# Patient Record
Sex: Female | Born: 1990 | Race: Black or African American | Hispanic: No | Marital: Single | Smoking: Current every day smoker
Health system: Southern US, Community
[De-identification: ages and names within clinical notes are randomized; demographics above are authoritative.]

## PROBLEM LIST (undated history)

## (undated) HISTORY — PX: MANDIBLE FRACTURE SURGERY: SHX706

---
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
## 2011-11-02 ENCOUNTER — Emergency Department: Payer: Self-pay | Admitting: Emergency Medicine

## 2011-11-03 LAB — URINALYSIS, COMPLETE
Bacteria: NONE SEEN
Glucose,UR: NEGATIVE mg/dL (ref 0–75)
Ketone: NEGATIVE
Leukocyte Esterase: NEGATIVE
Ph: 5 (ref 4.5–8.0)
Protein: NEGATIVE
RBC,UR: <1 /HPF (ref 0–5)
WBC UR: 1 /HPF (ref 0–5)

## 2011-11-03 LAB — CBC
HGB: 13 g/dL (ref 12.0–16.0)
MCH: 31.4 pg (ref 26.0–34.0)
MCHC: 33.7 g/dL (ref 32.0–36.0)
MCV: 93 fL (ref 80–100)
Platelet: 258 10*3/uL (ref 150–440)
RDW: 13 % (ref 11.5–14.5)

## 2011-11-03 LAB — BASIC METABOLIC PANEL
BUN: 9 mg/dL (ref 7–18)
Calcium, Total: 8.9 mg/dL (ref 8.5–10.1)
Co2: 26 mmol/L (ref 21–32)
EGFR (Non-African Amer.): >60
Potassium: 3.8 mmol/L (ref 3.5–5.1)
Sodium: 142 mmol/L (ref 136–145)

## 2011-11-03 LAB — WET PREP, GENITAL

## 2011-11-03 LAB — HCG, QUANTITATIVE, PREGNANCY: Beta Hcg, Quant.: 1737 m[IU]/mL — ABNORMAL HIGH

## 2011-11-05 ENCOUNTER — Emergency Department: Payer: Self-pay | Admitting: Emergency Medicine

## 2011-11-05 LAB — URINALYSIS, COMPLETE
Bilirubin,UR: NEGATIVE
Glucose,UR: NEGATIVE mg/dL (ref 0–75)
Nitrite: NEGATIVE
RBC,UR: <1 /HPF (ref 0–5)
Squamous Epithelial: 3

## 2011-11-05 LAB — CBC
HCT: 39.5 % (ref 35.0–47.0)
HGB: 13.1 g/dL (ref 12.0–16.0)
MCH: 30.9 pg (ref 26.0–34.0)
MCHC: 33.2 g/dL (ref 32.0–36.0)
RDW: 12.3 % (ref 11.5–14.5)

## 2011-11-05 LAB — WET PREP, GENITAL

## 2011-11-05 LAB — HCG, QUANTITATIVE, PREGNANCY: Beta Hcg, Quant.: 2923 m[IU]/mL — ABNORMAL HIGH

## 2011-12-06 ENCOUNTER — Emergency Department: Payer: Self-pay | Admitting: Emergency Medicine

## 2011-12-06 LAB — CBC
HCT: 39.7 % (ref 35.0–47.0)
HGB: 13.2 g/dL (ref 12.0–16.0)
MCH: 30.5 pg (ref 26.0–34.0)
MCHC: 33.2 g/dL (ref 32.0–36.0)
MCV: 92 fL (ref 80–100)
RDW: 11.9 % (ref 11.5–14.5)

## 2012-01-08 ENCOUNTER — Encounter: Payer: Self-pay | Admitting: Obstetrics & Gynecology

## 2012-01-26 ENCOUNTER — Encounter: Payer: Self-pay | Admitting: Maternal & Fetal Medicine

## 2012-03-02 ENCOUNTER — Encounter: Payer: Self-pay | Admitting: Pediatrics

## 2012-03-14 ENCOUNTER — Observation Stay: Payer: Self-pay | Admitting: Obstetrics & Gynecology

## 2012-03-14 LAB — URINALYSIS, COMPLETE
Bilirubin,UR: NEGATIVE
Blood: NEGATIVE
Glucose,UR: NEGATIVE mg/dL (ref 0–75)
Ketone: NEGATIVE
Leukocyte Esterase: NEGATIVE
Nitrite: NEGATIVE
Ph: 7 (ref 4.5–8.0)
Protein: NEGATIVE
RBC,UR: NONE SEEN /HPF (ref 0–5)
Specific Gravity: 1.014 (ref 1.003–1.030)

## 2012-05-31 ENCOUNTER — Observation Stay: Payer: Self-pay

## 2012-06-01 ENCOUNTER — Observation Stay: Payer: Self-pay

## 2012-06-01 LAB — URINALYSIS, COMPLETE
Bilirubin,UR: NEGATIVE
Blood: NEGATIVE
Glucose,UR: NEGATIVE mg/dL (ref 0–75)
Nitrite: NEGATIVE
Protein: NEGATIVE
Specific Gravity: 1.011 (ref 1.003–1.030)

## 2012-06-08 ENCOUNTER — Inpatient Hospital Stay: Payer: Self-pay | Admitting: Obstetrics and Gynecology

## 2012-06-08 LAB — CBC WITH DIFFERENTIAL/PLATELET
Basophil #: 0 10*3/uL (ref 0.0–0.1)
Basophil %: 0.2 %
Eosinophil #: 0.1 10*3/uL (ref 0.0–0.7)
HCT: 31.1 % — ABNORMAL LOW (ref 35.0–47.0)
Lymphocyte #: 2.9 10*3/uL (ref 1.0–3.6)
MCHC: 34.7 g/dL (ref 32.0–36.0)
Monocyte %: 7.9 %
Neutrophil #: 10.6 10*3/uL — ABNORMAL HIGH (ref 1.4–6.5)
Neutrophil %: 71.5 %
Platelet: 235 10*3/uL (ref 150–440)
RDW: 12.2 % (ref 11.5–14.5)
WBC: 14.8 10*3/uL — ABNORMAL HIGH (ref 3.6–11.0)

## 2012-06-09 LAB — HEMATOCRIT: HCT: 28.6 % — ABNORMAL LOW (ref 35.0–47.0)

## 2012-12-27 ENCOUNTER — Emergency Department: Payer: Self-pay | Admitting: Emergency Medicine

## 2012-12-27 LAB — CBC
HCT: 35.8 % (ref 35.0–47.0)
MCH: 28.7 pg (ref 26.0–34.0)
MCV: 87 fL (ref 80–100)
Platelet: 286 10*3/uL (ref 150–440)
RBC: 4.12 10*6/uL (ref 3.80–5.20)
WBC: 11 10*3/uL (ref 3.6–11.0)

## 2012-12-27 LAB — URINALYSIS, COMPLETE
Bacteria: NONE SEEN
Bilirubin,UR: NEGATIVE
Glucose,UR: NEGATIVE mg/dL (ref 0–75)
Ketone: NEGATIVE
Protein: NEGATIVE
RBC,UR: 2 /HPF (ref 0–5)
Specific Gravity: 1.016 (ref 1.003–1.030)
WBC UR: 1 /HPF (ref 0–5)

## 2013-05-27 ENCOUNTER — Observation Stay: Payer: Self-pay

## 2013-07-09 ENCOUNTER — Observation Stay: Payer: Self-pay | Admitting: Obstetrics & Gynecology

## 2013-07-21 ENCOUNTER — Ambulatory Visit: Payer: Self-pay | Admitting: Obstetrics and Gynecology

## 2013-07-21 LAB — CBC WITH DIFFERENTIAL/PLATELET
Basophil %: 0.4 %
MCH: 29.4 pg (ref 26.0–34.0)
MCHC: 34.2 g/dL (ref 32.0–36.0)
Monocyte %: 5.8 %
Neutrophil #: 11.1 10*3/uL — ABNORMAL HIGH (ref 1.4–6.5)
Neutrophil %: 78 %
Platelet: 242 10*3/uL (ref 150–440)
RBC: 3.78 10*6/uL — ABNORMAL LOW (ref 3.80–5.20)
RDW: 12.9 % (ref 11.5–14.5)

## 2013-07-22 ENCOUNTER — Inpatient Hospital Stay: Payer: Self-pay | Admitting: Obstetrics and Gynecology

## 2013-07-23 LAB — HEMATOCRIT: HCT: 27 % — ABNORMAL LOW (ref 35.0–47.0)

## 2014-03-10 IMAGING — US US OB DETAIL+14 WK - NRPT [facility]
1 series · 14 of 28 positions shown · non-contrast
Comparison: none

[Series 1: us ob detail+14 wk - nrpt [facility] · 14 of 89 slices shown]
[im 4/89]
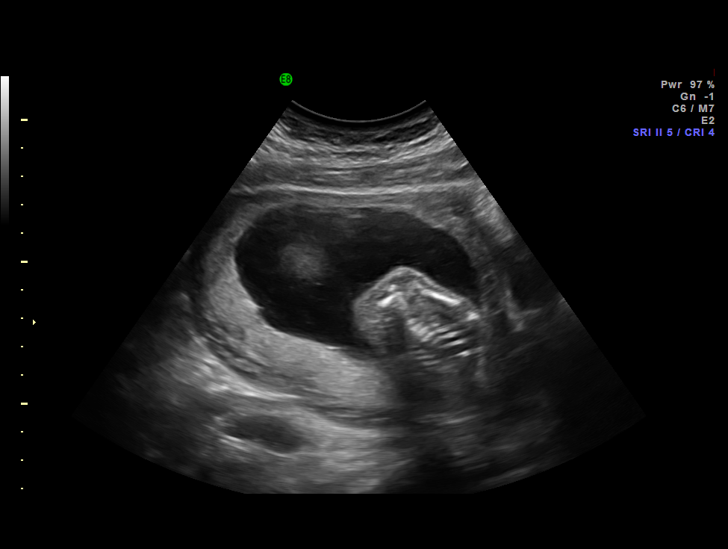
[im 10/89]
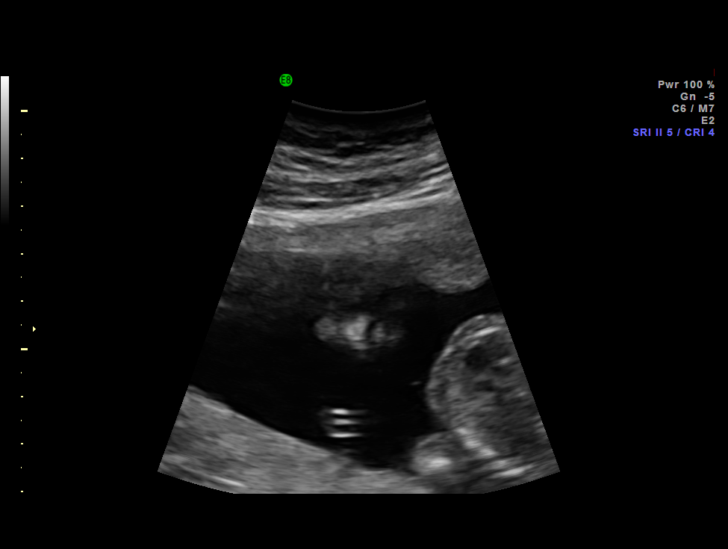
[im 17/89]
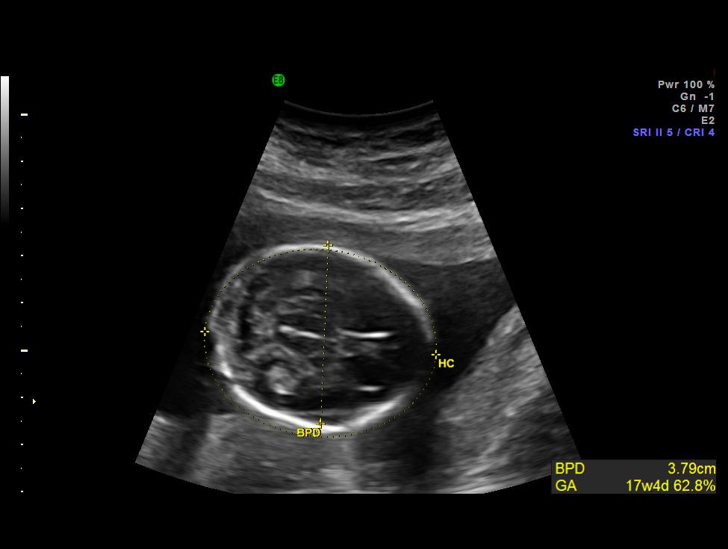
[im 23/89]
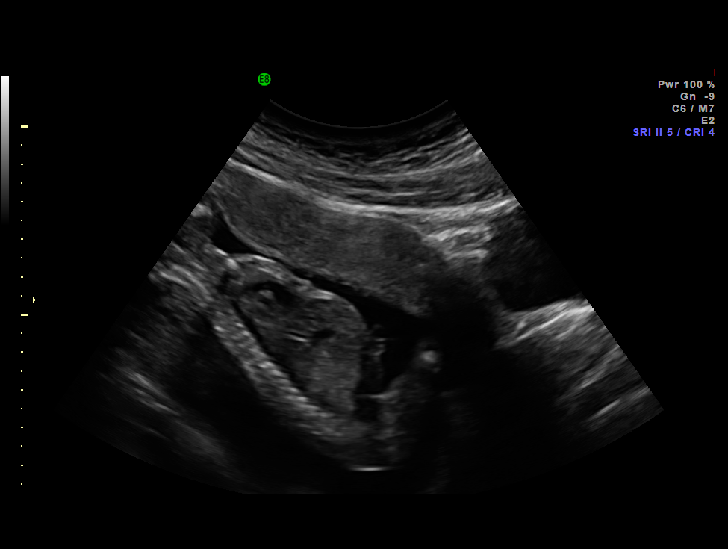
[im 30/89]
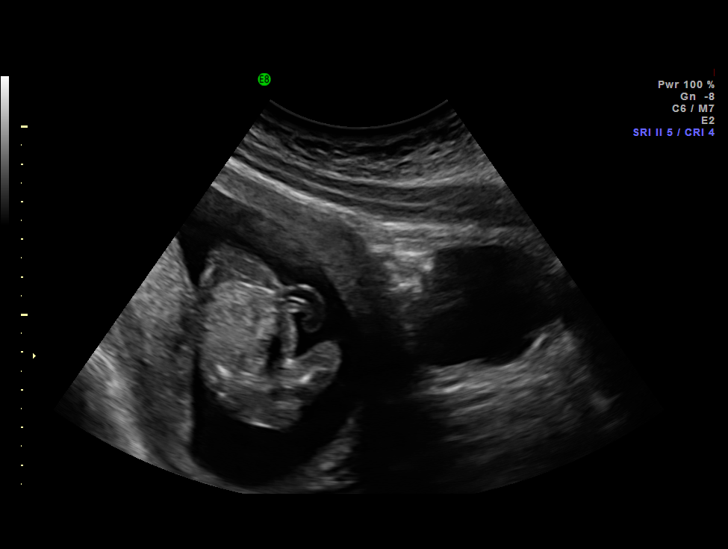
[im 36/89]
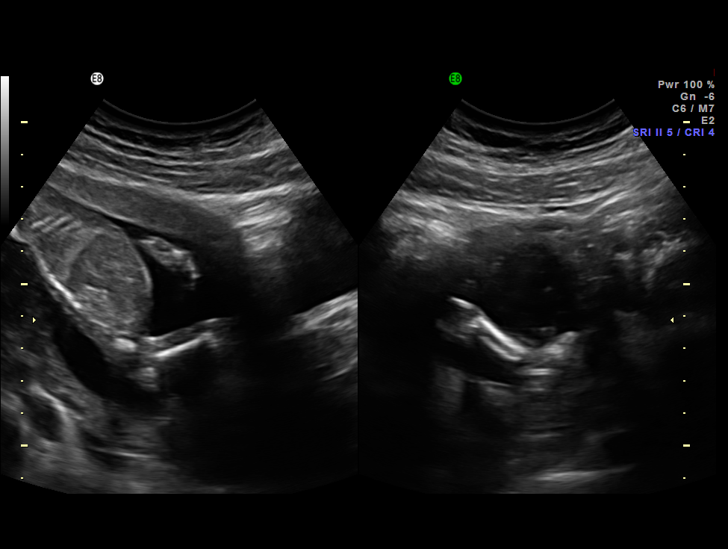
[im 43/89]
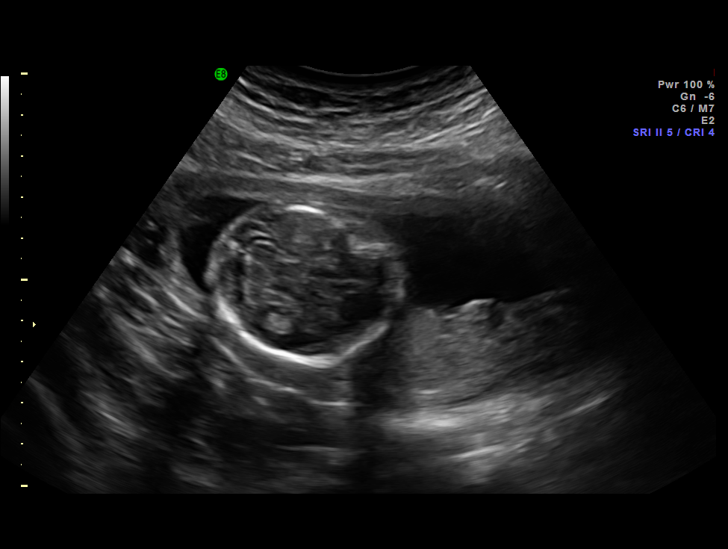
[im 49/89]
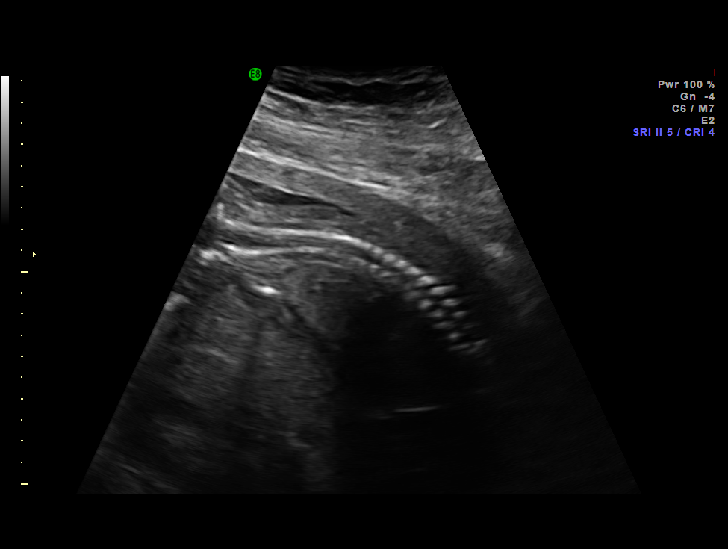
[im 56/89]
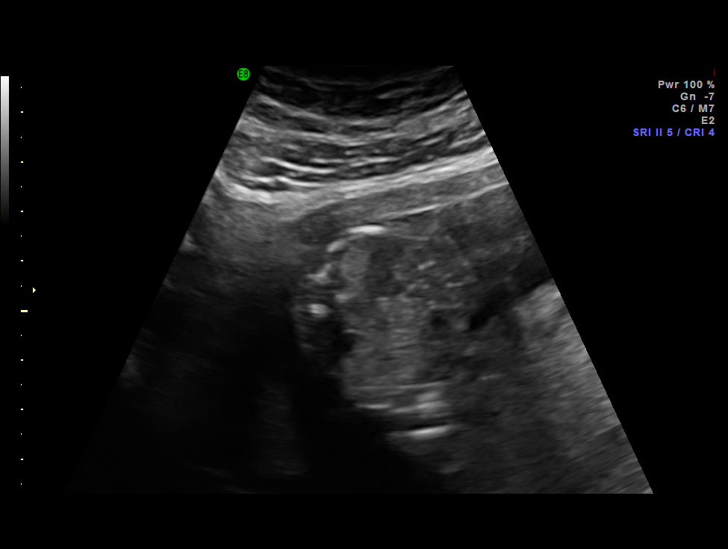
[im 62/89]
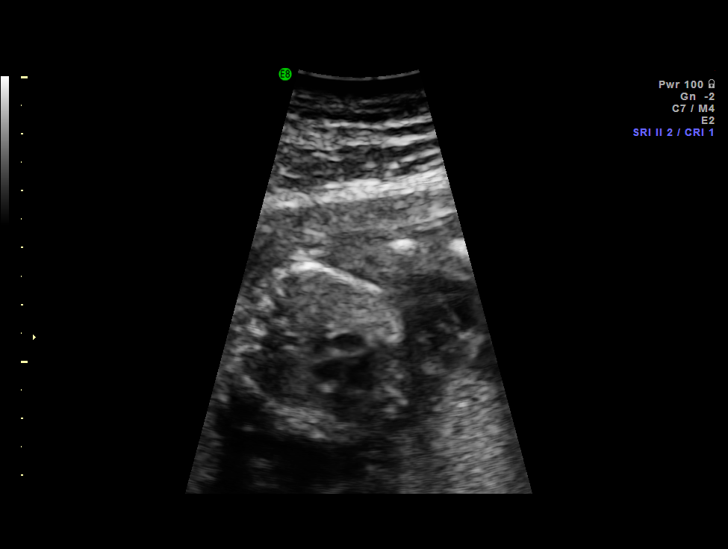
[im 69/89]
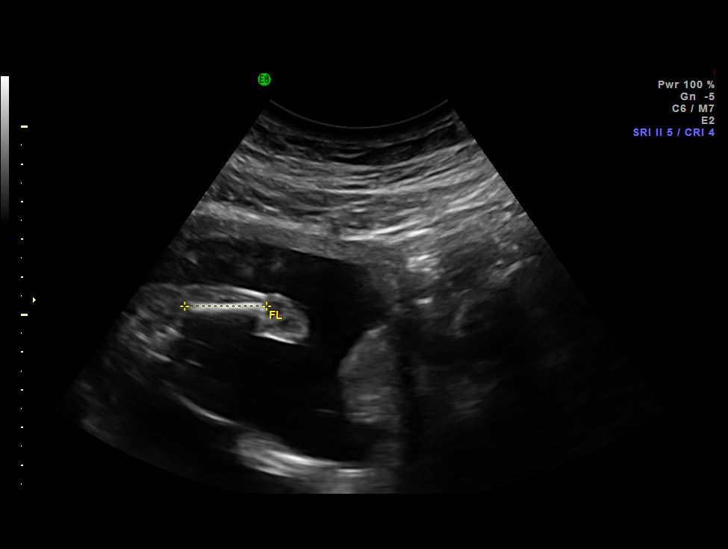
[im 75/89]
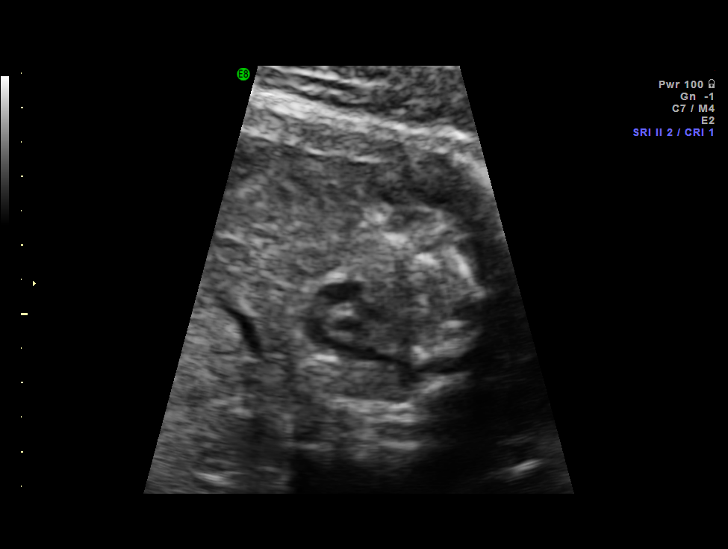
[im 82/89]
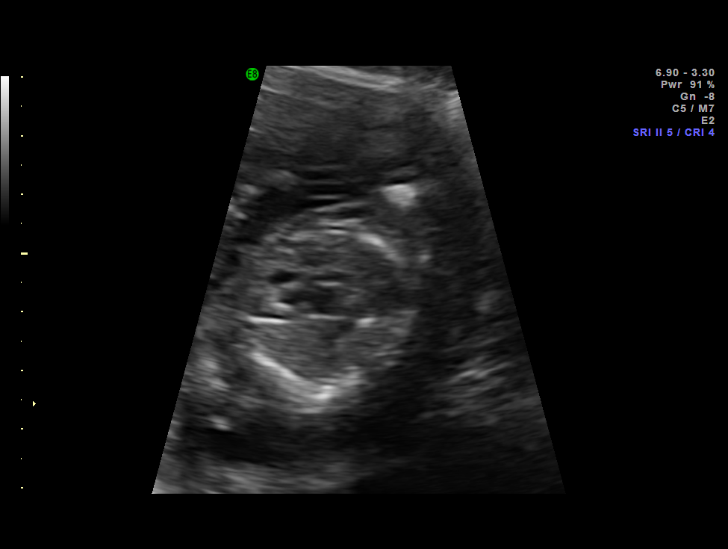
[im 89/89]
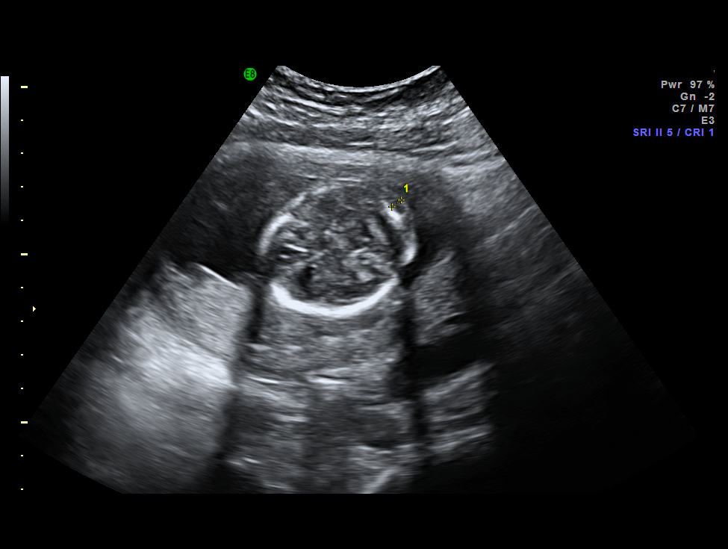

[14 of 28 positions shown; findings below may reference images not displayed]

IMAGES IMPORTED FROM THE SYNGO WORKFLOW SYSTEM
NO DICTATION FOR STUDY

## 2014-08-20 ENCOUNTER — Emergency Department: Payer: Self-pay | Admitting: Emergency Medicine

## 2015-02-13 NOTE — Op Note (Signed)
PATIENT NAME:  Heather GrizzleLATTERO, Leaner L MR#:  [id number] DATE OF BIRTH:  1991/10/11  DATE OF PROCEDURE:  06/08/2012  PREOPERATIVE DIAGNOSIS: Failure to progress.   POSTOPERATIVE DIAGNOSIS: Failure to progress.  PROCEDURE :  Low transverse cesarean section.   SURGEON: Senaida LangeLashawn Weaver-Lee, MD   ASSISTANT: Jettie Paganarcy Putnam   ANESTHESIA: General.   ESTIMATED BLOOD LOSS: 600 mL.   OPERATIVE FLUIDS: 700 mL.   COMPLICATIONS: None.   FINDINGS: Vertex female infant, 2390 grams, Apgars 9 and 9. Normal uterus, tubes, and ovaries.   SPECIMEN: None.   INDICATIONS: The patient is a 24 year old who presents with premature preterm rupture of membranes at 36 weeks. The baby has known Down syndrome. She was given Pitocin for labor augmentation; however, she did not progress past 3 cm. Decision was made to proceed towards cesarean section for delivery. The risks, benefits, indications, and alternatives of the procedure were explained and informed consent was obtained.   PROCEDURE: The patient was taken to the Operating Room with IV fluids running. She was prepped and draped in the usual sterile fashion in leftward tilt. A Pfannenstiel skin incision was made and carried down to underlying fascia with the knife. The fascia was nicked in the midline. The incision was extended laterally. The superior aspect of the fascia was grasped with Kocher's and the underlying rectus muscles dissected off. This was repeated on the inferior fascia. The rectus muscles were divided in the midline, and the peritoneum was entered bluntly. The opening was extended. A bladder blade was placed. The vesicouterine peritoneum was grasped with pick-ups and entered sharply with Metzenbaum's. The bladder flap was created digitally. The hysterotomy incision was made and carried down to underlying fetal parts. The incision was extended. The infant's head was grasped and an attempt to deliver was difficult, therefore vacuum suction was removed. The baby's  head was delivered atraumatically. The mouth and nose were bulb suctioned. The anterior and posterior shoulder were delivered followed by the remainder of the body. The cord was clamped x2 and cut. The baby was handed to the awaiting neonatologist. The placenta was expressed. The uterus was exteriorized and cleared of all clot and debris. The hysterotomy incision was repaired with a #0 Monocryl in a running locked fashion. The uterus was returned to the abdomen. The abdomen and gutters were irrigated with copious amounts of warm normal saline. The peritoneum was closed with 2-0 Vicryl. The On-Q pump apparatus was placed according to manufacturer's instructions. The rectus muscles were reapproximated. The fascia was closed with a #1 PDS. The skin was closed with 4-0 Vicryl on a Keith needle. The On-Q catheters were bolused with 0.5% Sensorcaine, 5 mL each. The catheters were secured to the patient's abdomen using Steri-Strips and Tegaderm. The patient tolerated procedure well. She was awakened from anesthesia and taken to the recovery room in stable condition. Sponge, needle and instrument counts were correct x2.  ____________________________ Sonda PrimesLashawn A. Patton SallesWeaver-Lee, MD law:cbb D: 06/08/2012 23:43:38 ET T: 06/09/2012 11:13:36 ET JOB#: [id number]  cc: Flint MelterLashawn A. Patton SallesWeaver-Lee, MD, <Dictator> Sonda PrimesLASHAWN A WEAVER LEE MD ELECTRONICALLY SIGNED 06/11/2012 10:29

## 2015-02-16 NOTE — Op Note (Signed)
PATIENT NAME:  Heather George, Heather George MR#:  [id number] DATE OF BIRTH:  [birth year]  DATE OF PROCEDURE:  07/22/2013  PREOPERATIVE DIAGNOSIS: Previous cesarean section, desires repeat.   POSTOPERATIVE DIAGNOSIS: Previous cesarean section, desires repeat.   PROCEDURE:  Repeat low transverse cesarean section.  SURGEON: Senaida LangeLashawn Weaver Lee, MD  ASSISTANT: Annamarie MajorPaul Harris, MD   ANESTHESIA: Spinal.  ESTIMATED BLOOD LOSS: 500 mL.  OPERATIVE FLUIDS: 1800 mL.  URINE OUTPUT: 450 mL.   COMPLICATIONS: None.   FINDINGS: Vertex female infant, 2930 grams, Apgars 9 and 9, normal uterus, tubes and ovaries.   INDICATION: The patient is a 24 year old G2, P1-0-0-1 who presents for repeat C-section. Risks, benefits, indications and alternatives of the procedure were explained and informed consent was obtained.   DESCRIPTION OF PROCEDURE: The patient was taken to the OR with IV fluids running. She was prepped and draped in the usual sterile fashion. After spinal anesthesia was placed, she was placed in a leftward tilt. A Pfannenstiel skin incision was made and carried down to underlying fascia with a knife. The fascia was nicked in the midline. The incision was extended laterally. The superior aspect of the fascia was grasped with Kocher clamps and the underlying rectus muscle dissected off. This was repeated on the inferior fascia. The rectus muscles were divided in the midline sharply using the Metzenbaum scissors. The opening was extended. The peritoneum was entered bluntly. The bladder blade was placed. The vesicouterine peritoneum was grasped with pick-ups and entered sharply with the Metzenbaum scissors. The bladder flap was created digitally. The hysterotomy incision was made and carried down to underlying fetal membranes. The opening was extended. The infant's head was grasped. The membranes were ruptured with an Allis. The infant's head was delivered atraumatically through the hysterotomy incision followed by the  anterior and posterior shoulder and the remainder of the body. The mouth and nose were bulb suctioned. The cord was clamped x 2 and cut, and the infant was handed to the waiting nursery staff. The placenta was expressed. The uterus was exteriorized and cleared of all clot and debris. The hysterotomy incision was repaired with a #0 Monocryl in a running locked fashion. The uterus was returned to the abdomen. The gutters were irrigated with copious amounts of warm normal saline. The peritoneum was closed with a #2-0 Vicryl. The On-Q pump apparatus was placed according to manufacturer's instructions. The fascia was closed with a #1 PDS. The skin was closed with 4-0 Vicryl. The catheters of the On-Q pump were both bolused with 5 mL of 0.5% bupivacaine plain. The openings through which the catheters were placed were closed and secured using Dermabond skin glue. The catheters were affixed to the patient's abdomen using Steri-Strips and Tegaderm. The patient tolerated the procedure well. Sponge, needle and instrument counts were correct x 2. The patient was taken to the recovery room in stable condition. ____________________________ Sonda PrimesLashawn A. Patton SallesWeaver-Lee, MD law:sb D: 07/22/2013 08:59:16 ET T: 07/22/2013 09:19:08 ET JOB#: [id number]  cc: Flint MelterLashawn A. Patton SallesWeaver-Lee, MD, <Dictator> Janyth ContesLASHAWN A WEAVER LEE MD ELECTRONICALLY SIGNED 08/08/2013 18:17

## 2015-03-06 NOTE — H&P (Signed)
L&D Evaluation:  History:  HPI 24 year old G3 P0111 with EDC=07/30/2013 presented at 31 weeks with c/o having a gush of fluid from the vagina when lying in bed this AM. Gush was accompanied by back pain. Has had no further leakage or back pain. Had IC last night. No VB. Good FM. PNC remarkable for close interconceptual spacing delivering 05/2012 via C-section at 36 weeks. First child has Down syndrome. Harmony testing this pregnancy was negative.   Presents with leaking fluid   Patient's Medical History No Chronic Illness   Patient's Surgical History D&C  Previous C-Section   Medications Pre Natal Vitamins   Allergies NKDA   Social History tobacco  drugs  MJ   Family History Non-Contributory   ROS:  ROS see HPI   Exam:  Vital Signs stable  116/69   Urine Protein not completed   General no apparent distress, appears anxious   Mental Status clear   Abdomen gravid, non-tender   Pelvic no external lesions, SSE: milky white fluid with negative pooling, Nitrazine, and fern. CX: closed/50%/_3   Mebranes Intact   FHT normal rate with no decels, 135-140 baseline with accels to 160s   FHT Description mod variability   Ucx absent   Skin dry   Impression:  Impression IUP at 31 weeks with no evidence of SROM   Plan:  Plan DC home with labor precautions.   [facility]   Electronic Signatures: Trinna BalloonGutierrez, Amalio Loe L (CNM)  (Signed 01-Aug-14 08:30)  Authored: L&D Evaluation   Last Updated: 01-Aug-14 08:30 by Trinna BalloonGutierrez, Vung Kush L (CNM)

## 2015-03-06 NOTE — H&P (Signed)
L&D Evaluation:  History Expanded:   H[facility] 24 [facility] [redacted]W[redacted]D.  Pt presents with SROM.  [facility].  PT HAS KNOWN DOWNS SYNDROME, TRISOME 21 INFANT    Blood Type (Maternal) O positive    Group B Strep Results Maternal (Result >5wks must be treated as unknown) unknown/result > 5 weeks ago    Maternal HIV Negative    Maternal Syphilis Ab Nonreactive    Maternal Varicella Immune    Rubella Results (Maternal) immune    Presents with leaking fluid    Patient's Medical History No Chronic Illness    Patient's Surgical History none    Medications Pre Natal Vitamins  Zantac, Zoloft    Allergies NKDA    Social History tobacco   Exam:   Vital Signs stable    General no apparent distress    Mental Status clear    Chest clear    Heart normal sinus rhythm    Abdomen gravid, non-tender    Estimated Fetal Weight Average for gestational age    Mebranes Ruptured, copius clear fluid    Description clear    FHT normal rate with no decels   Impression:   Impression Prematue Rupture of Membranes   Plan:   Comments I have had a long and careful discussion with this patient about options,  Mutual decision made to commit to delivery.  Pt has been fully informed of the pros and cons, risk/benefits continued close observation versus the risks of induction. She understands that there are uncommon risks to induction, which include but are not limited to:  frequent and/or prolonged uterine contractions, fetal distress, uterine rupture and lack of success of induction.  She also has been informed that if the induction is not successful a Cesarean Section may be necessary.  All questions have been answered and she is in agreement with induction.   Electronic Signatures: Towana Badgerosenow, Jader Desai J (MD)  (Signed 13-Aug-13 07:29)  Authored: L&D Evaluation   Last Updated: 13-Aug-13 07:29 by Towana Badgerosenow, Lindbergh Winkles J (MD)

## 2015-03-06 NOTE — H&P (Signed)
L&D Evaluation:  History Expanded:  HPI 37 weeks, Prior Cesarean Section, DFM today (since last pm), although noted movement here on Labor and Delivery now.   Gravida 2   Term 1   Presents with decreased fetal movement   Patient's Medical History No Chronic Illness   Patient's Surgical History Previous C-Section   Medications Pre Natal Vitamins   Allergies NKDA   Social History none   Family History Non-Contributory   ROS:  ROS All systems were reviewed.  HEENT, CNS, GI, GU, Respiratory, CV, Renal and Musculoskeletal systems were found to be normal.   Exam:  Vital Signs stable   General no apparent distress   Mental Status clear   Abdomen gravid, non-tender   Estimated Fetal Weight Average for gestational age   Back no CVAT   Edema no edema   FHT normal rate with no decels, REACTIVE   Fetal Heart Rate 140   Ucx absent   Impression:  Impression decreased fetal movement   Plan:  Plan Non-Stress Test Reactive   Comments Counseled on fetal movements/ monitoring.   Follow Up Appointment already scheduled. TUES   Electronic Signatures: Letitia LibraHarris, Leander Tout Paul (MD)  (Signed 13-Sep-14 09:57)  Authored: L&D Evaluation   Last Updated: 13-Sep-14 09:57 by Letitia LibraHarris, Lenin Kuhnle Paul (MD)

## 2015-03-06 NOTE — H&P (Signed)
L&D Evaluation:  History Expanded:   HPI 24 yo presents at 23 weeks w EPIGASTRIC PAIN and  mild n/v.  No contractions or rom or vb.  Pain is mod at times, imntermittant, no modifers or other context, no assoc symptoms.    Presents with abdominal pain    Patient's Medical History No Chronic Illness    Patient's Surgical History none    Medications Pre Natal Vitamins    Allergies NKDA    Social History none    Family History Non-Contributory   ROS:   ROS All systems were reviewed.  HEENT, CNS, GI, GU, Respiratory, CV, Renal and Musculoskeletal systems were found to be normal.   Exam:   Vital Signs stable    General no apparent distress    Mental Status clear    Chest clear    Heart normal sinus rhythm    Abdomen gravid, non-tender    Estimated Fetal Weight Average for gestational age    Back no CVAT    Edema no edema    FHT FHR 140    Skin dry   Impression:   Impression GERD, Abd Pain   Plan:   Plan fluids    Comments Zantac UA neg Note for work today Follow up office tomorrow   Electronic Signatures: Letitia LibraHarris, Kimiya Brunelle Paul (MD)  (Signed 19-May-13 20:51)  Authored: L&D Evaluation   Last Updated: 19-May-13 20:51 by Letitia LibraHarris, Melrose Kearse Paul (MD)

## 2016-01-06 ENCOUNTER — Emergency Department
Admission: EM | Admit: 2016-01-06 | Discharge: 2016-01-06 | Disposition: A | Discharge status: Home / Self Care | Payer: Self-pay | Attending: Student | Admitting: Student

## 2016-01-06 ENCOUNTER — Emergency Department: Payer: Self-pay

## 2016-01-06 ENCOUNTER — Encounter: Payer: Self-pay | Admitting: Emergency Medicine

## 2016-01-06 DIAGNOSIS — Y999 Unspecified external cause status: Secondary | ICD-10-CM | POA: Insufficient documentation

## 2016-01-06 DIAGNOSIS — F17210 Nicotine dependence, cigarettes, uncomplicated: Secondary | ICD-10-CM | POA: Insufficient documentation

## 2016-01-06 DIAGNOSIS — M79672 Pain in left foot: Secondary | ICD-10-CM

## 2016-01-06 DIAGNOSIS — Y9302 Activity, running: Secondary | ICD-10-CM | POA: Insufficient documentation

## 2016-01-06 DIAGNOSIS — Y929 Unspecified place or not applicable: Secondary | ICD-10-CM | POA: Insufficient documentation

## 2016-01-06 DIAGNOSIS — S99922A Unspecified injury of left foot, initial encounter: Secondary | ICD-10-CM | POA: Insufficient documentation

## 2016-01-06 DIAGNOSIS — W1849XA Other slipping, tripping and stumbling without falling, initial encounter: Secondary | ICD-10-CM | POA: Insufficient documentation

## 2016-01-06 MED ORDER — OXYCODONE HCL 5 MG PO TABS
ORAL_TABLET | ORAL | Status: AC
  Administered 2016-01-06: 5 mg via ORAL
  Filled 2016-01-06: qty 1

## 2016-01-06 MED ORDER — IBUPROFEN 600 MG PO TABS: 600.0000 mg | ORAL_TABLET | Freq: Three times a day (TID) | ORAL | Status: AC | PRN

## 2016-01-06 MED ORDER — OXYCODONE HCL 5 MG PO TABS
5.0000 mg | ORAL_TABLET | Freq: Once | ORAL | Status: AC
  Administered 2016-01-06: 5 mg via ORAL

## 2016-01-06 NOTE — ED Notes (Signed)
Reviewed d/c instructions, follow-up care, prescriptions, and use of ice/elevation with pt. Pt verbalized understanding.

## 2016-01-06 NOTE — ED Notes (Signed)
Pt says she heard her kids in the tub arguing and she thought they were hurting each other; went running into the bathroom and tripped in the doorway; c/o pain  To the top of her left foot;

## 2016-01-06 NOTE — ED Provider Notes (Signed)
[facility] Emergency Department Provider Note  ____________________________________________  Time seen: Approximately 1:52 AM  I have reviewed the triage vital signs and the nursing notes.   HISTORY  Chief Complaint Foot Pain    HPI Margarita GrizzleJohana L Jarriel is a 25 y.o. female no chronic medical problems who presents for evaluation of traumatic left foot pain which began suddenly just prior to arrival, constant since onset, currently moderate and worse with bearing weight. She reports that she heard her children arguing in the bathtub and was running towards the bathroom when she tripped and hit her foot on a loose floor board. She did not hit her head or lose consciousness. she denies any other pain complaints. No chest pain or difficulty breathing. She has otherwise been in her usual state of health.   History reviewed. No pertinent past medical history.  There are no active problems to display for this patient.   Past Surgical History  Procedure Laterality Date  . Cesarean section      No current outpatient prescriptions on file.  Allergies Review of patient's allergies indicates no known allergies.  History reviewed. No pertinent family history.  Social History Social History  Substance Use Topics  . Smoking status: Current Every Day Smoker -- 1.00 packs/day    Types: Cigarettes  . Smokeless tobacco: None  . Alcohol Use: Yes     Comment: 3-4 beers tonight    Review of Systems Constitutional: No fever/chills Eyes: No visual changes. ENT: No sore throat. Cardiovascular: Denies chest pain. Respiratory: Denies shortness of breath. Gastrointestinal: No abdominal pain.  No nausea, no vomiting.  No diarrhea.  No constipation. Genitourinary: Negative for dysuria. Musculoskeletal: Negative for back pain. Skin: Negative for rash. Neurological: Negative for headaches, focal weakness or numbness.  10-point ROS otherwise  negative.  ____________________________________________   PHYSICAL EXAM:  VITAL SIGNS: ED Triage Vitals  Enc Vitals Group     BP 01/06/16 0020 139/74 mmHg     Pulse Rate 01/06/16 0020 97     Resp 01/06/16 0020 18     Temp 01/06/16 0020 98.2 F (36.8 C)     Temp Source 01/06/16 0020 Oral     SpO2 01/06/16 0020 98 %     Weight 01/06/16 0020 179 lb (81.194 kg)     Height 01/06/16 0020 5\' 5"  (1.651 m)     Head Cir --      Peak Flow --      Pain Score 01/06/16 0020 10     Pain Loc --      Pain Edu? --      Excl. in GC? --     Constitutional: Alert and oriented. Well appearing and in no acute distress. Eyes: Conjunctivae are normal. PERRL. EOMI. Head: Atraumatic. Nose: No congestion/rhinnorhea. Mouth/Throat: Mucous membranes are moist.  Oropharynx non-erythematous. Neck: No stridor.  No cervical spine tenderness to palpation. Cardiovascular: Normal rate, regular rhythm. Grossly normal heart sounds.  Good peripheral circulation. Respiratory: Normal respiratory effort.  No retractions. Lungs CTAB. Gastrointestinal: Soft and nontender. No distention. No CVA tenderness. Genitourinary: deferred Musculoskeletal: There is moderate tenderness to palpation in the dorsum of the left foot without bony step-off or deformity or ecchymosis. 2+ left DP pulse, wiggles the toes. Neurologic:  Normal speech and language. No gross focal neurologic deficits are appreciated. No gait instability. Skin:  Skin is warm, dry and intact. No rash noted. Psychiatric: Mood and affect are normal. Speech and behavior are normal.  ____________________________________________   LABS (  all labs ordered are listed, but only abnormal results are displayed)  Labs Reviewed - No data to display ____________________________________________  EKG  none ____________________________________________  RADIOLOGY  Xray left foot IMPRESSION: 1. No acute finding. 2. 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 ____________________________________________   PROCEDURES  Procedure(s) performed: None  Critical Care performed: No  ____________________________________________   INITIAL IMPRESSION / ASSESSMENT AND PLAN / ED COURSE  Pertinent labs & imaging results that were available during my care of the patient were reviewed by me and considered in my medical decision making (see chart for details).  AMEERAH HUFFSTETLER is a 25 y.o. female no chronic medical problems who presents for evaluation of traumatic left foot pain. On exam, she is well-appearing and in no acute distress. Vital signs stable, she is afebrile. Clinical picture is most consistent with contusion. X-rays show no fracture or dislocation. Discussed return precautions, need for close PCP follow-up and she is comfortable with the discharge plan. DC home with supportive care. ____________________________________________   FINAL CLINICAL IMPRESSION(S) / ED DIAGNOSES  Final diagnoses:  Foot pain, left      Gayla Doss, MD 01/06/16 [phone]

## 2016-01-06 NOTE — ED Notes (Signed)
Pt. States she fell while running to bathroom and tripped on raised board in floor.  Pt. Denies LOC or falling to the ground.  Pt. States lt. Foot got twisted when she tripped.  Pt. Denies any other injury at this time.

## 2017-09-11 ENCOUNTER — Ambulatory Visit (HOSPITAL_COMMUNITY)
Admission: EM | Admit: 2017-09-11 | Discharge: 2017-09-11 | Disposition: A | Discharge status: Home / Self Care | Payer: Self-pay | Attending: Internal Medicine | Admitting: Internal Medicine

## 2017-09-11 ENCOUNTER — Encounter (HOSPITAL_COMMUNITY): Payer: Self-pay | Admitting: Emergency Medicine

## 2017-09-11 DIAGNOSIS — J00 Acute nasopharyngitis [common cold]: Secondary | ICD-10-CM

## 2017-09-11 MED ORDER — FLUTICASONE PROPIONATE 50 MCG/ACT NA SUSP: 2.0000 | Freq: Every day | NASAL | 0 refills | Status: AC

## 2017-09-11 MED ORDER — CETIRIZINE-PSEUDOEPHEDRINE ER 5-120 MG PO TB12: 1.0000 | ORAL_TABLET | Freq: Every day | ORAL | 0 refills | Status: AC

## 2017-09-11 MED ORDER — BENZONATATE 100 MG PO CAPS: 100.0000 mg | ORAL_CAPSULE | Freq: Three times a day (TID) | ORAL | 0 refills | Status: AC

## 2017-09-11 NOTE — ED Triage Notes (Signed)
PT C/O: cold sx  ONSET: 2 days   SX INCLUDE: nasal congestion, hot flashes, chills, prod cough, nauseas, HA  DENIES: fevers  TAKING MEDS: Mucinex w/no relief.   A&O x4... NAD... Ambulatory

## 2017-09-11 NOTE — ED Provider Notes (Signed)
[facility]    CSN: [id number] Arrival date & time: 09/11/17  1128     History   Chief Complaint Chief Complaint  Patient presents with  . URI    HPI Heather George is a 26 y.o. female.   26 year old female comes in with 2 day history of URI symptoms. She is experiencing nasal congestion, hot flashes, chills, productive cough, nausea, headache, sore throat, body aches. Denies fever, ear pain. Denies swelling of the throat, trouble breathing, trouble swallowing. Chest soreness with coughing. Denies shortness of breath, wheezing. Taken mucinex without relief. Current everyday smoker, 1ppd, 11 pack year history.       History reviewed. No pertinent past medical history.  There are no active problems to display for this patient.   Past Surgical History:  Procedure Laterality Date  . CESAREAN SECTION      OB History    No data available       Home Medications    Prior to Admission medications   Medication Sig Start Date End Date Taking? Authorizing Provider  benzonatate (TESSALON) 100 MG capsule Take 1 capsule (100 mg total) every 8 (eight) hours by mouth. 09/11/17   Cathie HoopsYu, Asenath Balash V, PA-C  cetirizine-pseudoephedrine (ZYRTEC-D) 5-120 MG tablet Take 1 tablet daily by mouth. 09/11/17   Demontrae Gilbert V, PA-C  fluticasone (FLONASE) 50 MCG/ACT nasal spray Place 2 sprays daily into both nostrils. 09/11/17   Cathie HoopsYu, Aspasia Rude V, PA-C  ibuprofen (ADVIL,MOTRIN) 600 MG tablet Take 1 tablet (600 mg total) by mouth every 8 (eight) hours as needed for moderate pain. Take this medication only if you are sure you're not pregnant. 01/06/16   Gayla DossGayle, Eryka A, MD    Family History History reviewed. No pertinent family history.  Social History Social History   Tobacco Use  . Smoking status: Current Every Day Smoker    Packs/day: 1.00    Types: Cigarettes  . Smokeless tobacco: Never Used  Substance Use Topics  . Alcohol use: Yes    Comment: 3-4 beers tonight  . Drug use: No      Allergies   Patient has no known allergies.   Review of Systems Review of Systems  Reason unable to perform ROS: See HPI as above.     Physical Exam Triage Vital Signs ED Triage Vitals  Enc Vitals Group     BP 09/11/17 1232 (!) 103/55     Pulse Rate 09/11/17 1232 90     Resp 09/11/17 1232 (!) 22     Temp 09/11/17 1232 98.3 F (36.8 C)     Temp Source 09/11/17 1232 Oral     SpO2 09/11/17 1232 99 %     Weight --      Height --      Head Circumference --      Peak Flow --      Pain Score 09/11/17 1233 4     Pain Loc --      Pain Edu? --      Excl. in GC? --    No data found.  Updated Vital Signs BP (!) 103/55 (BP Location: Left Arm)   Pulse 90   Temp 98.3 F (36.8 C) (Oral)   Resp (!) 22   LMP 08/29/2017   SpO2 99%   Physical Exam  Constitutional: She is oriented to person, place, and time. She appears well-developed and well-nourished. No distress.  HENT:  Head: Normocephalic and atraumatic.  Right Ear: External ear and ear  canal normal. Tympanic membrane is erythematous. Tympanic membrane is not bulging.  Left Ear: External ear and ear canal normal. Tympanic membrane is erythematous. Tympanic membrane is not bulging.  Nose: Mucosal edema and rhinorrhea present. Right sinus exhibits maxillary sinus tenderness. Right sinus exhibits no frontal sinus tenderness. Left sinus exhibits maxillary sinus tenderness. Left sinus exhibits no frontal sinus tenderness.  Mouth/Throat: Uvula is midline and mucous membranes are normal. Posterior oropharyngeal erythema present. Tonsils are 0 on the right. Tonsils are 0 on the left. No tonsillar exudate.  Eyes: Conjunctivae are normal. Pupils are equal, round, and reactive to light.  Neck: Normal range of motion. Neck supple.  Cardiovascular: Normal rate, regular rhythm and normal heart sounds. Exam reveals no gallop and no friction rub.  No murmur heard. Pulmonary/Chest: Effort normal and breath sounds normal. She has no  decreased breath sounds. She has no wheezes. She has no rhonchi. She has no rales.  Lymphadenopathy:    She has no cervical adenopathy.  Neurological: She is alert and oriented to person, place, and time.  Skin: Skin is warm and dry.  Psychiatric: She has a normal mood and affect. Her behavior is normal. Judgment normal.     UC Treatments / Results  Labs (all labs ordered are listed, but only abnormal results are displayed) Labs Reviewed - No data to display  EKG  EKG Interpretation None       Radiology No results found.  Procedures Procedures (including critical care time)  Medications Ordered in UC Medications - No data to display   Initial Impression / Assessment and Plan / UC Course  I have reviewed the triage vital signs and the nursing notes.  Pertinent labs & imaging results that were available during my care of the patient were reviewed by me and considered in my medical decision making (see chart for details).    Discussed with patient history and exam most consistent with viral URI. Symptomatic treatment as needed. Push fluids. Return precautions given.   Final Clinical Impressions(s) / UC Diagnoses   Final diagnoses:  Acute nasopharyngitis    ED Discharge Orders        Ordered    fluticasone (FLONASE) 50 MCG/ACT nasal spray  Daily     09/11/17 1338    cetirizine-pseudoephedrine (ZYRTEC-D) 5-120 MG tablet  Daily     09/11/17 1338    benzonatate (TESSALON) 100 MG capsule  Every 8 hours     09/11/17 1338        Belinda FisherYu, Obrien Huskins V, PA-C 09/11/17 1346

## 2017-09-11 NOTE — Discharge Instructions (Signed)
Tessalon for cough. Start flonase, zyrtec-D for nasal congestion. You can use over the counter nasal saline rinse such as neti pot for nasal congestion. Keep hydrated, your urine should be clear to pale yellow in color. Tylenol/motrin for fever and pain. Monitor for any worsening of symptoms, chest pain, shortness of breath, wheezing, swelling of the throat, follow up for reevaluation.  ° °For sore throat try using a honey-based tea. Use 3 teaspoons of honey with juice squeezed from half lemon. Place shaved pieces of ginger into 1/2-1 cup of water and warm over stove top. Then mix the ingredients and repeat every 4 hours as needed. ° °

## 2018-03-02 ENCOUNTER — Encounter (HOSPITAL_COMMUNITY): Payer: Self-pay | Admitting: *Deleted

## 2018-03-02 ENCOUNTER — Other Ambulatory Visit: Payer: Self-pay

## 2018-03-02 ENCOUNTER — Emergency Department (HOSPITAL_COMMUNITY)
Admission: EM | Admit: 2018-03-02 | Discharge: 2018-03-02 | Disposition: A | Discharge status: Home / Self Care | Payer: Self-pay | Attending: Emergency Medicine | Admitting: Emergency Medicine

## 2018-03-02 ENCOUNTER — Encounter (HOSPITAL_COMMUNITY): Payer: Self-pay | Admitting: Emergency Medicine

## 2018-03-02 DIAGNOSIS — Z79899 Other long term (current) drug therapy: Secondary | ICD-10-CM | POA: Insufficient documentation

## 2018-03-02 DIAGNOSIS — K029 Dental caries, unspecified: Secondary | ICD-10-CM | POA: Insufficient documentation

## 2018-03-02 DIAGNOSIS — Z5321 Procedure and treatment not carried out due to patient leaving prior to being seen by health care provider: Secondary | ICD-10-CM | POA: Insufficient documentation

## 2018-03-02 DIAGNOSIS — K0889 Other specified disorders of teeth and supporting structures: Secondary | ICD-10-CM

## 2018-03-02 DIAGNOSIS — F17210 Nicotine dependence, cigarettes, uncomplicated: Secondary | ICD-10-CM | POA: Insufficient documentation

## 2018-03-02 MED ORDER — BUPIVACAINE HCL (PF) 0.25 % IJ SOLN
10.0000 mL | Freq: Once | INTRAMUSCULAR | Status: AC
  Administered 2018-03-02: 10 mL
  Filled 2018-03-02: qty 30

## 2018-03-02 MED ORDER — TRAMADOL HCL 50 MG PO TABS: 50.0000 mg | ORAL_TABLET | Freq: Four times a day (QID) | ORAL | 0 refills | Status: AC | PRN

## 2018-03-02 MED ORDER — AMOXICILLIN 500 MG PO CAPS
500.0000 mg | ORAL_CAPSULE | Freq: Once | ORAL | Status: AC
  Administered 2018-03-02: 500 mg via ORAL
  Filled 2018-03-02: qty 1

## 2018-03-02 MED ORDER — AMOXICILLIN 500 MG PO CAPS: 500.0000 mg | ORAL_CAPSULE | Freq: Three times a day (TID) | ORAL | 0 refills | Status: AC

## 2018-03-02 NOTE — ED Notes (Signed)
Called pt to re-assess vitals x2, no answer

## 2018-03-02 NOTE — ED Triage Notes (Signed)
C/o toothache right lower back tooth

## 2018-03-02 NOTE — ED Provider Notes (Signed)
[facility]-EMERGENCY DEPT Provider Note   CSN: [id number] Arrival date & time: 03/02/18  0944     History   Chief Complaint Chief Complaint  Patient presents with  . Dental Pain    HPI Heather George is a 27 y.o. female.  HPI Patient presents to the ED with no pertinent past medical history for evaluation of dental pain.  Reports ongoing right bottom molar for about the past week.  Reports taking BC powders without any relief.  Patient does not have dental insurance and cannot follow-up with a dentist.  Denies any difficulties breathing or swallowing.  Denies any associated facial pain, swelling, fevers, chills.  Nothing makes symptoms better.  Reports the pain is a 10/10.  She did not take anything today prior to coming to the ED.  Denies any neck stiffness.  Denies any associated nausea or vomiting. History reviewed. No pertinent past medical history.  There are no active problems to display for this patient.   Past Surgical History:  Procedure Laterality Date  . CESAREAN SECTION    . MANDIBLE FRACTURE SURGERY       OB History   None      Home Medications    Prior to Admission medications   Medication Sig Start Date End Date Taking? Authorizing Provider  fluticasone (FLONASE) 50 MCG/ACT nasal spray Place 2 sprays daily into both nostrils. 09/11/17  Yes Yu, Amy V, PA-C  ibuprofen (ADVIL,MOTRIN) 600 MG tablet Take 1 tablet (600 mg total) by mouth every 8 (eight) hours as needed for moderate pain. Take this medication only if you are sure you're not pregnant. 01/06/16  Yes Gayla Doss, MD  amoxicillin (AMOXIL) 500 MG capsule Take 1 capsule (500 mg total) by mouth 3 (three) times daily. 03/02/18   Rise Mu, PA-C  benzonatate (TESSALON) 100 MG capsule Take 1 capsule (100 mg total) every 8 (eight) hours by mouth. 09/11/17   Cathie Hoops, Amy V, PA-C  cetirizine-pseudoephedrine (ZYRTEC-D) 5-120 MG tablet Take 1 tablet daily by mouth. 09/11/17   Cathie Hoops, Amy  V, PA-C  traMADol (ULTRAM) 50 MG tablet Take 1 tablet (50 mg total) by mouth every 6 (six) hours as needed. 03/02/18   Rise Mu, PA-C    Family History No family history on file.  Social History Social History   Tobacco Use  . Smoking status: Current Every Day Smoker    Packs/day: 1.00    Types: Cigarettes  . Smokeless tobacco: Never Used  Substance Use Topics  . Alcohol use: Yes    Comment: 3-4 beers tonight  . Drug use: No     Allergies   Patient has no known allergies.   Review of Systems Review of Systems  Constitutional: Negative for chills and fever.  HENT: Positive for dental problem. Negative for congestion and trouble swallowing.   Respiratory: Negative for shortness of breath.   Gastrointestinal: Negative for nausea and vomiting.     Physical Exam Updated Vital Signs BP 121/80 (BP Location: Left Arm)   Pulse 75   Temp 98.2 F (36.8 C) (Oral)   Resp 16   LMP 02/08/2018   SpO2 99%   Physical Exam  Constitutional: She appears well-developed and well-nourished. No distress.  HENT:  Head: Normocephalic and atraumatic.  Mouth/Throat: Uvula is midline, oropharynx is clear and moist and mucous membranes are normal. Abnormal dentition. Dental caries present.    Oropharynx is clear.  No sublingual or submandibular swelling.  Tolerating secretions managing airway.  No facial swelling.  Eyes: Right eye exhibits no discharge. Left eye exhibits no discharge. No scleral icterus.  Neck: Normal range of motion. Neck supple.  Pulmonary/Chest: No respiratory distress.  Musculoskeletal: Normal range of motion.  Lymphadenopathy:    She has no cervical adenopathy.  Neurological: She is alert.  Skin: No pallor.  Psychiatric: Her behavior is normal. Judgment and thought content normal.  Nursing note and vitals reviewed.    ED Treatments / Results  Labs (all labs ordered are listed, but only abnormal results are displayed) Labs Reviewed - No data to  display  EKG None  Radiology No results found.  Procedures Dental Block Date/Time: 03/02/2018 5:09 PM Performed by: Rise Mu, PA-C Authorized by: Rise Mu, PA-C   Consent:    Consent obtained:  Verbal   Consent given by:  Patient   Risks discussed:  Allergic reaction, hematoma, intravascular injection, infection, nerve damage, pain, unsuccessful block and swelling   Alternatives discussed:  No treatment Indications:    Indications: dental pain   Location:    Block type:  Inferior alveolar   Laterality:  Right Procedure details (see MAR for exact dosages):    Needle gauge:  27 G   Anesthetic injected:  Bupivacaine 0.5% w/o epi   Injection procedure:  Anatomic landmarks identified, introduced needle, incremental injection, negative aspiration for blood and anatomic landmarks palpated Post-procedure details:    Outcome:  Pain relieved   Patient tolerance of procedure:  Tolerated well, no immediate complications   (including critical care time)  Medications Ordered in ED Medications  bupivacaine (PF) (MARCAINE) 0.25 % injection 10 mL (10 mLs Infiltration Given by Other 03/02/18 1222)  amoxicillin (AMOXIL) capsule 500 mg (500 mg Oral Given 03/02/18 1222)     Initial Impression / Assessment and Plan / ED Course  I have reviewed the triage vital signs and the nursing notes.  Pertinent labs & imaging results that were available during my care of the patient were reviewed by me and considered in my medical decision making (see chart for details).     Patient with toothache.  No gross abscess.  Exam unconcerning for Ludwig's angina or spread of infection.  Will treat with penicillin and pain medicine.  Urged patient to follow-up with dentist.     Final Clinical Impressions(s) / ED Diagnoses   Final diagnoses:  Pain, dental    ED Discharge Orders        Ordered    amoxicillin (AMOXIL) 500 MG capsule  3 times daily     03/02/18 1212    traMADol  (ULTRAM) 50 MG tablet  Every 6 hours PRN     03/02/18 1212       Rise Mu, PA-C 03/02/18 1710    Mancel Bale, MD 03/02/18 1724

## 2018-03-02 NOTE — Discharge Instructions (Addendum)

## 2018-03-02 NOTE — ED Triage Notes (Addendum)
Pt c/o right lower dental pain that been on going for about a week. Taking BC powders but not burning when taking in her teeth. Doesn't have dental insurance to see one. Adds that she was at [facility] this morning and got triaged but when [facility] opened at 7am went there but was told it would take a while due to all their appointments and she would get good care at [facility].

## 2023-03-09 ENCOUNTER — Encounter: Payer: Self-pay | Admitting: Family Medicine

## 2023-03-09 ENCOUNTER — Ambulatory Visit
Admission: RE | Admit: 2023-03-09 | Discharge: 2023-03-09 | Disposition: A | Discharge status: Home / Self Care | Payer: Managed Care, Other (non HMO) | Source: Ambulatory Visit | Attending: Family Medicine | Admitting: Family Medicine

## 2023-03-09 ENCOUNTER — Other Ambulatory Visit: Payer: Self-pay | Admitting: Family Medicine

## 2023-03-09 DIAGNOSIS — M79672 Pain in left foot: Secondary | ICD-10-CM

## 2023-04-17 ENCOUNTER — Other Ambulatory Visit: Payer: Self-pay | Admitting: Nurse Practitioner

## 2023-04-17 DIAGNOSIS — R1011 Right upper quadrant pain: Secondary | ICD-10-CM

## 2023-04-17 DIAGNOSIS — K529 Noninfective gastroenteritis and colitis, unspecified: Secondary | ICD-10-CM

## 2023-04-17 DIAGNOSIS — R1031 Right lower quadrant pain: Secondary | ICD-10-CM

## 2023-04-20 ENCOUNTER — Ambulatory Visit
Admission: RE | Admit: 2023-04-20 | Discharge: 2023-04-20 | Disposition: A | Discharge status: Home / Self Care | Payer: Managed Care, Other (non HMO) | Source: Ambulatory Visit | Attending: Nurse Practitioner | Admitting: Nurse Practitioner

## 2023-04-20 DIAGNOSIS — R1011 Right upper quadrant pain: Secondary | ICD-10-CM

## 2023-04-20 DIAGNOSIS — R1031 Right lower quadrant pain: Secondary | ICD-10-CM

## 2023-04-20 DIAGNOSIS — K529 Noninfective gastroenteritis and colitis, unspecified: Secondary | ICD-10-CM

## 2023-04-20 MED ORDER — IOPAMIDOL (ISOVUE-300) INJECTION 61%
100.0000 mL | Freq: Once | INTRAVENOUS | Status: AC | PRN
  Administered 2023-04-20: 100 mL via INTRAVENOUS
